# Patient Record
Sex: Male | Born: 1990 | Race: Black or African American | Hispanic: No | Marital: Single | Smoking: Current every day smoker
Health system: Southern US, Community
[De-identification: ages and names within clinical notes are randomized; demographics above are authoritative.]

## PROBLEM LIST (undated history)

## (undated) DIAGNOSIS — J45909 Unspecified asthma, uncomplicated: Secondary | ICD-10-CM

---
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
## 2020-11-12 ENCOUNTER — Emergency Department (HOSPITAL_COMMUNITY)
Admission: EM | Admit: 2020-11-12 | Discharge: 2020-11-12 | Disposition: A | Discharge status: Home / Self Care | Payer: Self-pay | Attending: Emergency Medicine | Admitting: Emergency Medicine

## 2020-11-12 ENCOUNTER — Other Ambulatory Visit: Payer: Self-pay

## 2020-11-12 ENCOUNTER — Emergency Department (HOSPITAL_COMMUNITY): Payer: Self-pay

## 2020-11-12 ENCOUNTER — Encounter (HOSPITAL_COMMUNITY): Payer: Self-pay | Admitting: *Deleted

## 2020-11-12 DIAGNOSIS — R000 Tachycardia, unspecified: Secondary | ICD-10-CM | POA: Insufficient documentation

## 2020-11-12 DIAGNOSIS — R600 Localized edema: Secondary | ICD-10-CM | POA: Insufficient documentation

## 2020-11-12 DIAGNOSIS — J45901 Unspecified asthma with (acute) exacerbation: Secondary | ICD-10-CM | POA: Insufficient documentation

## 2020-11-12 DIAGNOSIS — Z20822 Contact with and (suspected) exposure to covid-19: Secondary | ICD-10-CM | POA: Insufficient documentation

## 2020-11-12 DIAGNOSIS — J189 Pneumonia, unspecified organism: Secondary | ICD-10-CM | POA: Insufficient documentation

## 2020-11-12 DIAGNOSIS — J45909 Unspecified asthma, uncomplicated: Secondary | ICD-10-CM | POA: Insufficient documentation

## 2020-11-12 DIAGNOSIS — Z28310 Unvaccinated for covid-19: Secondary | ICD-10-CM | POA: Insufficient documentation

## 2020-11-12 DIAGNOSIS — F17210 Nicotine dependence, cigarettes, uncomplicated: Secondary | ICD-10-CM | POA: Insufficient documentation

## 2020-11-12 HISTORY — DX: Unspecified asthma, uncomplicated: J45.909

## 2020-11-12 LAB — BASIC METABOLIC PANEL
Anion gap: 10 (ref 5–15)
BUN: 13 mg/dL (ref 6–20)
CO2: 19 mmol/L — ABNORMAL LOW (ref 22–32)
Calcium: 9.5 mg/dL (ref 8.9–10.3)
Chloride: 108 mmol/L (ref 98–111)
Creatinine, Ser: 1.18 mg/dL (ref 0.61–1.24)
GFR, Estimated: >60 mL/min (ref >60)
Glucose, Bld: 115 mg/dL — ABNORMAL HIGH (ref 70–99)
Potassium: 4.1 mmol/L (ref 3.5–5.1)
Sodium: 137 mmol/L (ref 135–145)

## 2020-11-12 LAB — RESP PANEL BY RT-PCR (FLU A&B, COVID) ARPGX2
Influenza A by PCR: NEGATIVE
Influenza B by PCR: NEGATIVE
SARS Coronavirus 2 by RT PCR: NEGATIVE

## 2020-11-12 LAB — CBC
HCT: 48.9 % (ref 39.0–52.0)
Hemoglobin: 16.4 g/dL (ref 13.0–17.0)
MCH: 29.8 pg (ref 26.0–34.0)
MCHC: 33.5 g/dL (ref 30.0–36.0)
MCV: 88.7 fL (ref 80.0–100.0)
Platelets: 313 10*3/uL (ref 150–400)
RBC: 5.51 MIL/uL (ref 4.22–5.81)
RDW: 11.9 % (ref 11.5–15.5)
WBC: 15.9 10*3/uL — ABNORMAL HIGH (ref 4.0–10.5)
nRBC: 0 % (ref 0.0–0.2)

## 2020-11-12 LAB — TROPONIN I (HIGH SENSITIVITY): Troponin I (High Sensitivity): 3 ng/L (ref <18)

## 2020-11-12 LAB — D-DIMER, QUANTITATIVE: D-Dimer, Quant: 0.37 ug/mL-FEU (ref 0.00–0.50)

## 2020-11-12 MED ORDER — MAGNESIUM SULFATE 2 GM/50ML IV SOLN
2.0000 g | Freq: Once | INTRAVENOUS | Status: AC
  Administered 2020-11-12: 2 g via INTRAVENOUS
  Filled 2020-11-12: qty 50

## 2020-11-12 MED ORDER — ALBUTEROL (5 MG/ML) CONTINUOUS INHALATION SOLN
10.0000 mg/h | INHALATION_SOLUTION | Freq: Once | RESPIRATORY_TRACT | Status: AC
  Administered 2020-11-12: 10 mg/h via RESPIRATORY_TRACT
  Filled 2020-11-12: qty 20

## 2020-11-12 MED ORDER — PREDNISONE 10 MG PO TABS: 20.0000 mg | ORAL_TABLET | Freq: Every day | ORAL | 0 refills | Status: AC

## 2020-11-12 MED ORDER — DOXYCYCLINE HYCLATE 100 MG PO TABS
100.0000 mg | ORAL_TABLET | Freq: Once | ORAL | Status: AC
  Administered 2020-11-12: 100 mg via ORAL
  Filled 2020-11-12: qty 1

## 2020-11-12 MED ORDER — ALBUTEROL SULFATE HFA 108 (90 BASE) MCG/ACT IN AERS: 1.0000 | INHALATION_SPRAY | Freq: Four times a day (QID) | RESPIRATORY_TRACT | 3 refills | Status: AC | PRN

## 2020-11-12 MED ORDER — METHYLPREDNISOLONE SODIUM SUCC 125 MG IJ SOLR
125.0000 mg | Freq: Once | INTRAMUSCULAR | Status: AC
  Administered 2020-11-12: 125 mg via INTRAVENOUS
  Filled 2020-11-12: qty 2

## 2020-11-12 MED ORDER — DOXYCYCLINE HYCLATE 100 MG PO CAPS: 100.0000 mg | ORAL_CAPSULE | Freq: Two times a day (BID) | ORAL | 0 refills | Status: AC

## 2020-11-12 NOTE — Discharge Instructions (Addendum)
Instructions:  You will need to take steroids for the next 4 days as prescribed to help with your asthma flareup.  Your next dose of prednisone is due tomorrow morning with breakfast.  You should give yourself 2 puffs of the albuterol inhaler every 4 hours for the next 2 days.  Then you can go back to using the inhaler as needed for wheezing.  I prescribed an antibiotic called doxycycline.  This is to treat you for possible pneumonia.  To be clear, we did not see any obvious signs of pneumonia on your x-ray, but you are having a thick cough and your white blood cell count was high, and I thought it was reasonable to treat you clinically for pneumonia.  You should complete the full course of antibiotics, even if you are feeling better.  Most importantly, you need to have a primary care provider who is managing this condition.  You are having very bad asthma attack.  Please call to make an appointment with a new PCP if you do not have one currently.  *  You can also take over-the-counter medicines as needed for fevers, coughs, and muscle pain.  You can take Tylenol and ibuprofen (I.e. advil, or motrin) as directed on the bottles bought over the counter.

## 2020-11-12 NOTE — ED Notes (Signed)
BSC given pt had an episode of N/V became diaphoretic and vomited.   Pt states that he has it happen frequently where he becomes hot sweaty and vomits then feels better.  Pt is now at 250 on the peak flow an HR is much improved from 150's to 120's

## 2020-11-12 NOTE — ED Notes (Signed)
RT notified and on the way down

## 2020-11-12 NOTE — ED Triage Notes (Signed)
Pt states cough and sob that increases with exertion yesterday accompanied by sternal chest pain and vomiting when he coughs.

## 2020-11-12 NOTE — ED Provider Notes (Signed)
[facility] EMERGENCY DEPARTMENT Provider Note   CSN: [id number] Arrival date & time: 11/12/20  0539     History Chief Complaint  Patient presents with  . Shortness of Breath    Mark Nunez is a 30 y.o. male with a history of childhood asthma presenting to emergency department with cough, shortness of breath, chest pain.  He reports that his symptoms began while he was moving furniture yesterday.  He says he has had a productive cough, feels nauseous and had dry heaving.  He describes a tightness across his entire chest.  He says he feels extremely short of breath, worse with ambulation across the room.    He denies having an asthma flareup in many years since childhood.  He says it was exercise-induced at the time.  He does not currently take any medications.  He does smoke cigarettes, but denies other drug use.  He is not vaccinated for COVID.  He denies any recent fevers, chills, myalgias, diarrhea, headache.  He is not currently taking steroids or in the recent past.  No hemoptysis or asymmetric LE edema. Patient denies personal or family history of DVT or PE. No recent hormone use (including OCP); travel for >6 hours; prolonged immobilization for greater than 3 days; surgeries or trauma in the last 4 weeks; or malignancy with treatment within 6 months.   HPI     Past Medical History:  Diagnosis Date  . Asthma     There are no problems to display for this patient.   History reviewed. No pertinent surgical history.     No family history on file.  Social History   Tobacco Use  . Smoking status: Current Every Day Smoker    Packs/day: 0.25    Types: Cigarettes  . Smokeless tobacco: Never Used  Substance Use Topics  . Alcohol use: Not Currently  . Drug use: Never    Home Medications Prior to Admission medications   Medication Sig Start Date End Date Taking? Authorizing Provider  albuterol (VENTOLIN HFA) 108 (90 Base) MCG/ACT inhaler  Inhale 1-2 puffs into the lungs every 6 (six) hours as needed for wheezing or shortness of breath. 11/12/20  Yes Xanthe Couillard, Kermit Balo, MD  doxycycline (VIBRAMYCIN) 100 MG capsule Take 1 capsule (100 mg total) by mouth 2 (two) times daily for 7 days. 11/12/20 11/19/20 Yes Jedi Catalfamo, Kermit Balo, MD  predniSONE (DELTASONE) 10 MG tablet Take 2 tablets (20 mg total) by mouth daily for 4 days. 11/13/20 11/17/20 Yes Tyleah Loh, Kermit Balo, MD    Allergies    Patient has no known allergies.  Review of Systems   Review of Systems  Constitutional: Negative for chills and fever.  Eyes: Negative for pain and visual disturbance.  Respiratory: Positive for chest tightness and shortness of breath.   Cardiovascular: Negative for chest pain and palpitations.  Gastrointestinal: Positive for nausea. Negative for abdominal pain and vomiting.  Genitourinary: Negative for dysuria and hematuria.  Musculoskeletal: Negative for arthralgias and back pain.  Skin: Negative for color change and rash.  Neurological: Negative for syncope and headaches.  All other systems reviewed and are negative.   Physical Exam Updated Vital Signs BP (!) 166/88   Pulse (!) 119   Temp 98.2 F (36.8 C) (Oral)   Resp (!) 22   Ht 5\' 5"  (1.651 m)   Wt 59.4 kg   SpO2 99%   BMI 21.79 kg/m   Physical Exam Constitutional:      General: He is not  in acute distress. HENT:     Head: Normocephalic and atraumatic.  Eyes:     Conjunctiva/sclera: Conjunctivae normal.     Pupils: Pupils are equal, round, and reactive to light.  Cardiovascular:     Rate and Rhythm: Regular rhythm. Tachycardia present.     Comments: HR 110 bpm Pulmonary:     Comments: 95% on room air Diffuse expiratory wheezing Productive cough Abdominal:     General: There is no distension.     Tenderness: There is no abdominal tenderness.  Musculoskeletal:     Right lower leg: Edema present.     Left lower leg: Edema present.  Skin:    General: Skin is warm and dry.   Neurological:     General: No focal deficit present.     Mental Status: He is alert and oriented to person, place, and time. Mental status is at baseline.  Psychiatric:        Mood and Affect: Mood normal.        Behavior: Behavior normal.     ED Results / Procedures / Treatments   Labs (all labs ordered are listed, but only abnormal results are displayed) Labs Reviewed  BASIC METABOLIC PANEL - Abnormal; Notable for the following components:      Result Value   CO2 19 (*)    Glucose, Bld 115 (*)    All other components within normal limits  CBC - Abnormal; Notable for the following components:   WBC 15.9 (*)    All other components within normal limits  RESP PANEL BY RT-PCR (FLU A&B, COVID) ARPGX2  D-DIMER, QUANTITATIVE  TROPONIN I (HIGH SENSITIVITY)    EKG EKG Interpretation  Date/Time:  Saturday Nov 12 2020 07:29:48 EDT Ventricular Rate:  108 PR Interval:  138 QRS Duration: 80 QT Interval:  342 QTC Calculation: 458 R Axis:   74 Text Interpretation: Sinus tachycardia Likely LVH pattern NO STEMI Confirmed by Alvester Chou [phone]) on 11/12/2020 8:25:24 AM   Radiology DG Chest 2 View  Result Date: 11/12/2020 CLINICAL DATA:  30 year old male with shortness of breath and chest pain. EXAM: CHEST - 2 VIEW COMPARISON:  Chest radiographs 10/24/2010 and earlier. FINDINGS: Normal lung volumes and mediastinal contours. Visualized tracheal air column is within normal limits. Both lungs appear clear. No pneumothorax or pleural effusion. No acute osseous abnormality identified. Subtle levoconvex upper thoracic scoliosis. Negative visible bowel gas pattern. IMPRESSION: Negative.  No cardiopulmonary abnormality. Electronically Signed   By: Odessa Fleming M.D.   On: 11/12/2020 08:09    Procedures Procedures   Medications Ordered in ED Medications  albuterol (PROVENTIL,VENTOLIN) solution continuous neb (10 mg/hr Nebulization Given 11/12/20 1015)  methylPREDNISolone sodium succinate  (SOLU-MEDROL) 125 mg/2 mL injection 125 mg (125 mg Intravenous Given 11/12/20 0956)  magnesium sulfate IVPB 2 g 50 mL (2 g Intravenous New Bag/Given 11/12/20 1125)  doxycycline (VIBRA-TABS) tablet 100 mg (100 mg Oral Given 11/12/20 1121)    ED Course  I have reviewed the triage vital signs and the nursing notes.  Pertinent labs & imaging results that were available during my care of the patient were reviewed by me and considered in my medical decision making (see chart for details).  This patient complains of cough, wheezing. This involves an extensive number of treatment options, and is a complaint that carries with it a high risk of complications and morbidity.  The differential diagnosis includes asthma exacerbation vs bronchitis vs PNA vs viral illness vs other  Mark Nunez was evaluated  in Emergency Department on 11/12/2020 for the symptoms described in the history of present illness. He was evaluated in the context of the global COVID-19 pandemic, which necessitated consideration that the patient might be at risk for infection with the SARS-CoV-2 virus that causes COVID-19. Institutional protocols and algorithms that pertain to the evaluation of patients at risk for COVID-19 are in a state of rapid change based on information released by regulatory bodies including the CDC and federal and state organizations. These policies and algorithms were followed during the patient's care in the ED.   I ordered, reviewed, and interpreted labs. WBC 15.9.  BMP unremarkable.  Covid/flu negative.  Ddimer negative.  Trop 3.  Doubt PE, ACS, aortic dissection. I ordered medication Iv magnesium, albuteral continuous, doxycycline for wheezing/asthma and possible PNA I personally viewed the patient's x-ray of the chest which shows no focal infiltrate.  However, with his productive cough, leukocytosis, tachycardia, I think it is reasonable to treat him for bacterial pneumonia with 7 days of doxycycline.  Patient  arrived with some tachycardia in the setting of his wheezing and chest tightness, which I strongly suspect was related to his asthma exacerbation.  This was exacerbated by the albuterol that we gave him.  However the time of discharge his heart rate returned back to 105.  I have a lower suspicion at this point for acute myocarditis, pericarditis, or pulmonary embolism with his work-up.  Clinical Course as of 11/12/20 1225  Sat Nov 12, 2020  1100 D-Dimer, Quant: 0.37 [MT]  1100 Patient is breathing better on the albuterol treatment. [MT]  1110 My reassessment his wheezing has improved, but he still has pretty have significant wheezing.  We will add some IV magnesium.  He has been on 30 minutes currently of nebulizers will continue for another 30 minutes. [MT]  1110 With his leukocytosis productive cough, and his tachycardia coming in, I do think is reasonable to treat him with a course of antibiotics for possible pneumonia.  We will start him on doxycycline as well.  His D-dimer was negative, the lower suspicion for acute PE. [MT]  1212 Patient became flushed and had an episode of vomiting.  He now feels significantly better.  He reports that "I always vomit when my heart rate gets too high and I started to feel hot".  I suspect he was having beta-adrenergic effects from the continuous nebulizers, which she completed 1 hour of.  His heart rate is now back to 105.  He is otherwise well-appearing and breathing more comfortably.  His wheezing is improved.  He is okay for discharge. [MT]    Clinical Course User Index [MT] Terald Sleeper, MD    Final Clinical Impression(s) / ED Diagnoses Final diagnoses:  Exacerbation of asthma, unspecified asthma severity, unspecified whether persistent  Pneumonia  Rx / DC Orders ED Discharge Orders         Ordered    predniSONE (DELTASONE) 10 MG tablet  Daily        11/12/20 1216    albuterol (VENTOLIN HFA) 108 (90 Base) MCG/ACT inhaler  Every 6 hours PRN         11/12/20 1216    doxycycline (VIBRAMYCIN) 100 MG capsule  2 times daily        11/12/20 1216           Terald Sleeper, MD 11/12/20 1225

## 2022-07-22 IMAGING — CR DG CHEST 2V
2 series · 2 of 2 positions shown · non-contrast
Comparison: Chest radiographs 10/24/2010 and earlier.

CLINICAL DATA: 29-year-old male with shortness of breath and chest
pain.

EXAM:
CHEST - 2 VIEW

[chest pa]
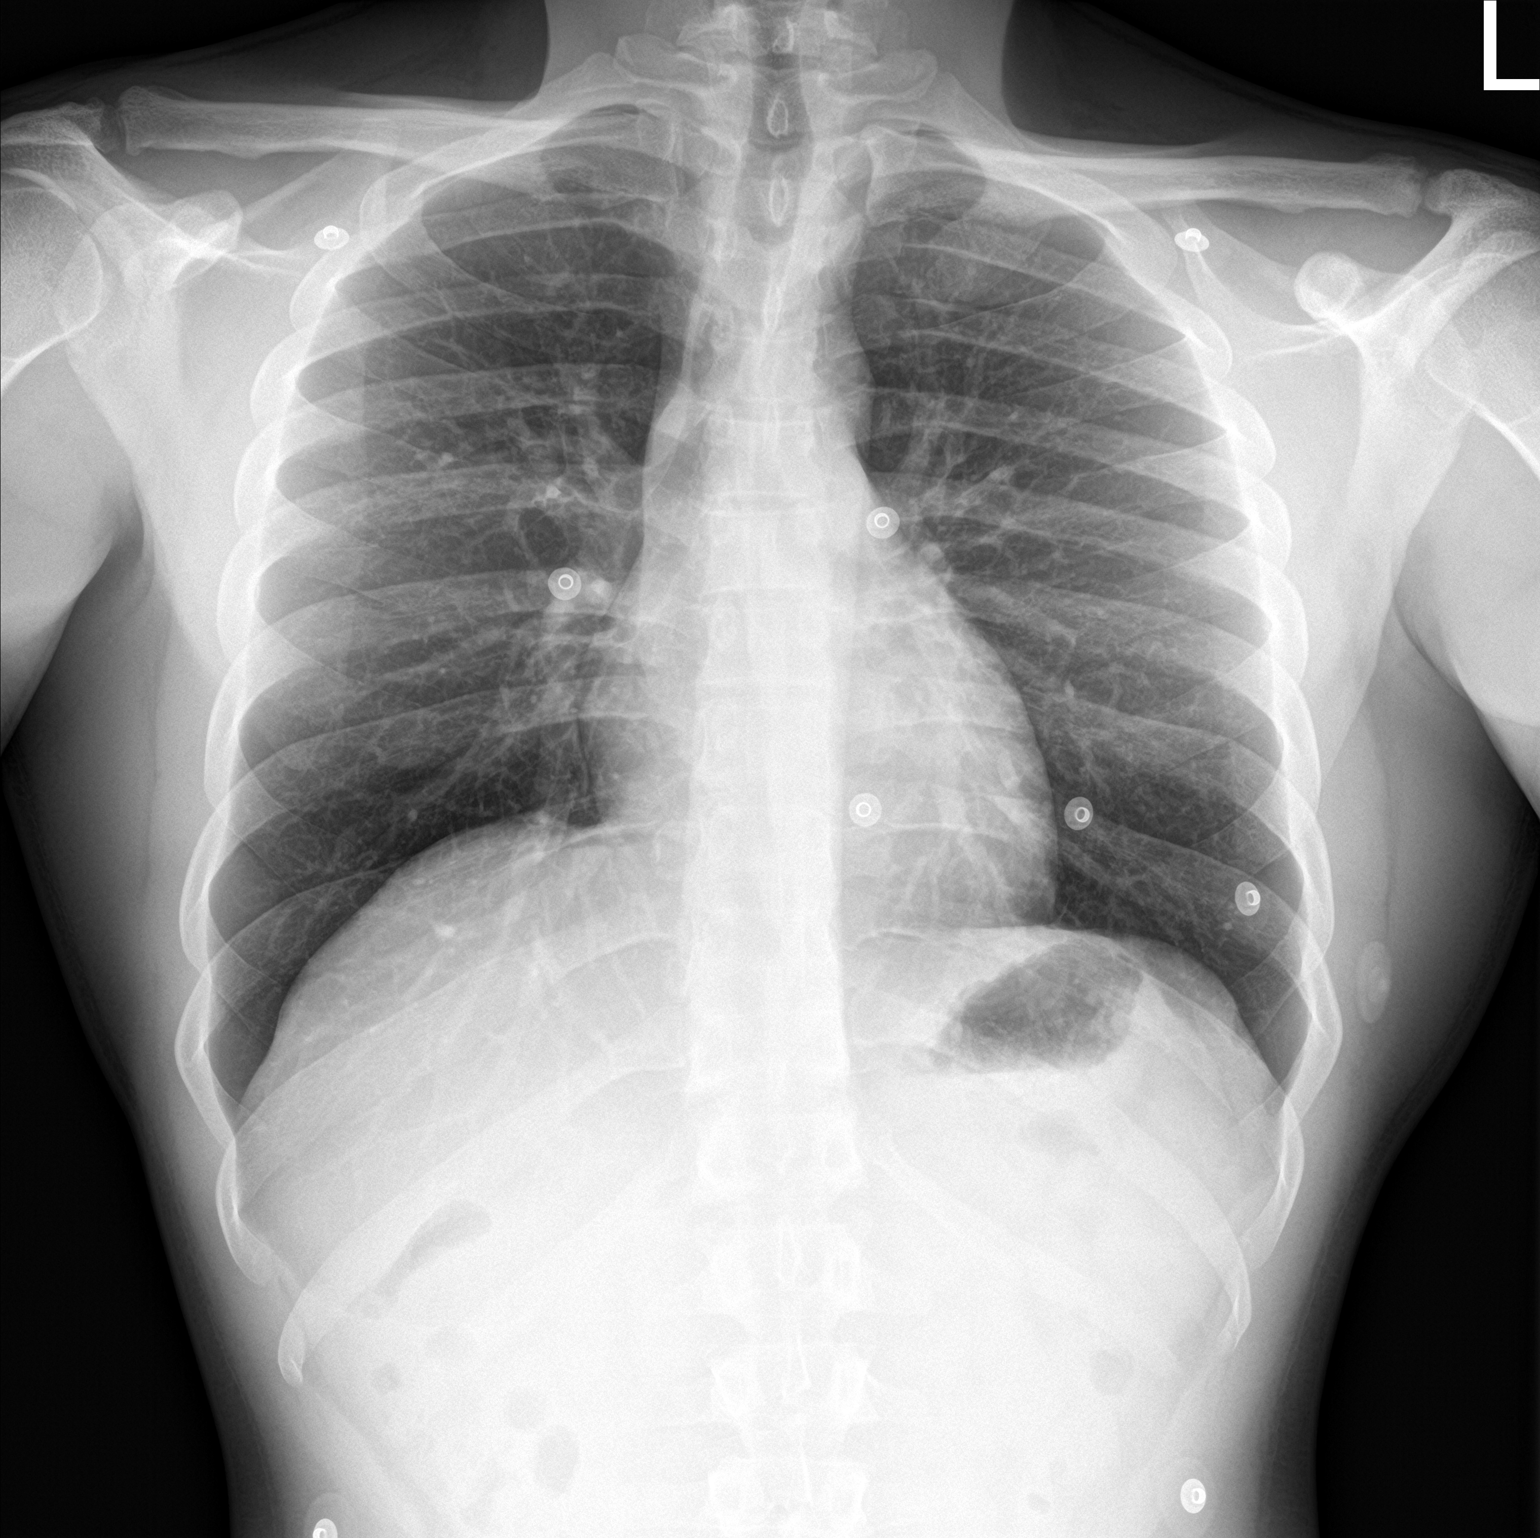

[chest lat]
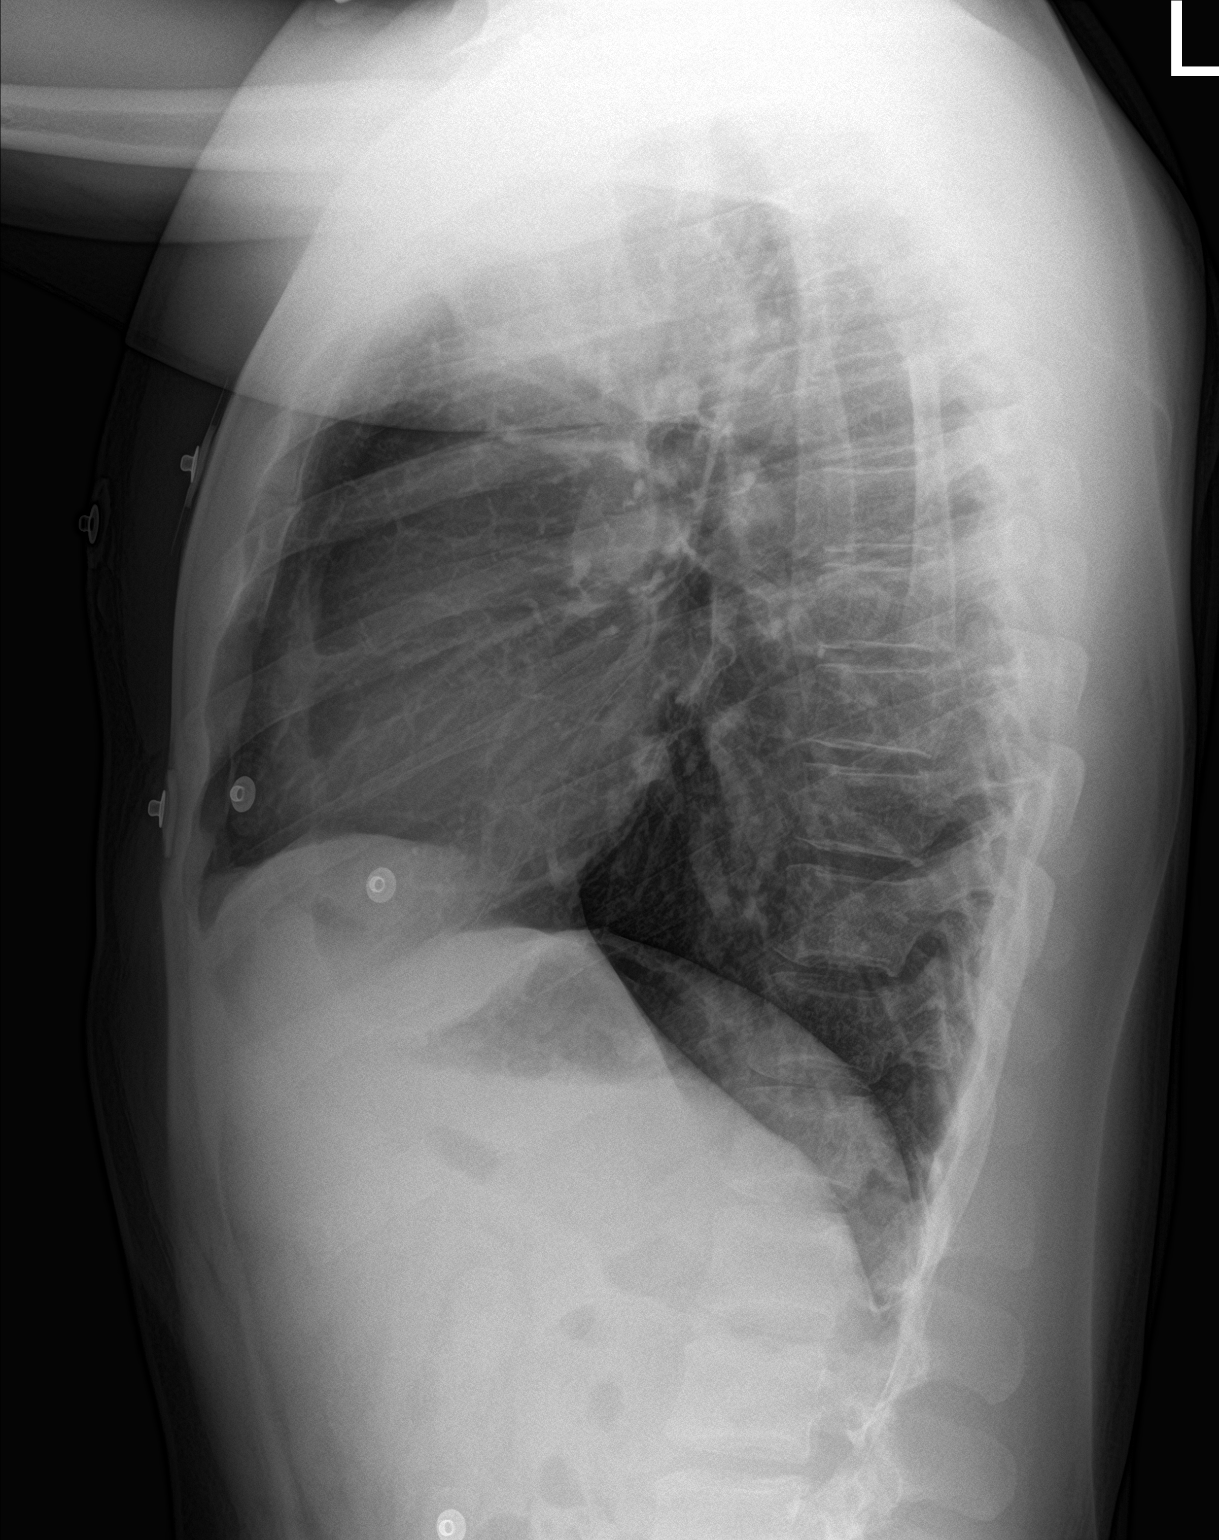

[2 of 2 positions shown; findings below may reference images not displayed]

FINDINGS: Normal lung volumes and mediastinal contours. Visualized tracheal
air column is within normal limits. Both lungs appear clear. No
pneumothorax or pleural effusion.

No acute osseous abnormality identified. Subtle levoconvex upper
thoracic scoliosis. Negative visible bowel gas pattern.
IMPRESSION: Negative.  No cardiopulmonary abnormality.
